# Patient Record
Sex: Male | Born: 1955 | Race: White | Hispanic: No | Marital: Married | Smoking: Never smoker
Health system: Southern US, Community
[De-identification: ages and names within clinical notes are randomized; demographics above are authoritative.]

## PROBLEM LIST (undated history)

## (undated) DIAGNOSIS — K219 Gastro-esophageal reflux disease without esophagitis: Secondary | ICD-10-CM

## (undated) HISTORY — PX: RHINOPLASTY: SHX2354

## (undated) HISTORY — PX: WISDOM TOOTH EXTRACTION: SHX21

## (undated) HISTORY — PX: HEMORROIDECTOMY: SUR656

## (undated) HISTORY — PX: WRIST SURGERY: SHX841

## (undated) HISTORY — PX: TRANSURETHRAL RESECTION OF PROSTATE: SHX73

---

## 1977-04-18 HISTORY — PX: APPENDECTOMY: SHX54

## 2009-04-18 HISTORY — PX: ROTATOR CUFF REPAIR: SHX139

## 2011-03-30 DIAGNOSIS — R972 Elevated prostate specific antigen [PSA]: Secondary | ICD-10-CM | POA: Insufficient documentation

## 2011-03-30 DIAGNOSIS — Z87442 Personal history of urinary calculi: Secondary | ICD-10-CM | POA: Insufficient documentation

## 2012-02-10 DIAGNOSIS — N401 Enlarged prostate with lower urinary tract symptoms: Secondary | ICD-10-CM | POA: Insufficient documentation

## 2012-03-29 DIAGNOSIS — Z8042 Family history of malignant neoplasm of prostate: Secondary | ICD-10-CM | POA: Insufficient documentation

## 2013-06-25 DIAGNOSIS — K222 Esophageal obstruction: Secondary | ICD-10-CM | POA: Insufficient documentation

## 2014-01-24 ENCOUNTER — Emergency Department (HOSPITAL_COMMUNITY)
Admission: EM | Admit: 2014-01-24 | Discharge: 2014-01-24 | Disposition: A | Discharge status: Home / Self Care | Payer: Worker's Compensation | Attending: Emergency Medicine | Admitting: Emergency Medicine

## 2014-01-24 ENCOUNTER — Emergency Department (HOSPITAL_COMMUNITY): Payer: Worker's Compensation

## 2014-01-24 ENCOUNTER — Encounter (HOSPITAL_COMMUNITY): Payer: Self-pay | Admitting: Emergency Medicine

## 2014-01-24 DIAGNOSIS — Y990 Civilian activity done for income or pay: Secondary | ICD-10-CM | POA: Diagnosis not present

## 2014-01-24 DIAGNOSIS — Y9289 Other specified places as the place of occurrence of the external cause: Secondary | ICD-10-CM | POA: Diagnosis not present

## 2014-01-24 DIAGNOSIS — S82892A Other fracture of left lower leg, initial encounter for closed fracture: Secondary | ICD-10-CM

## 2014-01-24 DIAGNOSIS — S92152A Displaced avulsion fracture (chip fracture) of left talus, initial encounter for closed fracture: Secondary | ICD-10-CM | POA: Insufficient documentation

## 2014-01-24 DIAGNOSIS — Z8719 Personal history of other diseases of the digestive system: Secondary | ICD-10-CM | POA: Diagnosis not present

## 2014-01-24 DIAGNOSIS — X58XXXA Exposure to other specified factors, initial encounter: Secondary | ICD-10-CM | POA: Insufficient documentation

## 2014-01-24 DIAGNOSIS — Y9389 Activity, other specified: Secondary | ICD-10-CM | POA: Insufficient documentation

## 2014-01-24 DIAGNOSIS — S99912A Unspecified injury of left ankle, initial encounter: Secondary | ICD-10-CM | POA: Diagnosis present

## 2014-01-24 HISTORY — DX: Gastro-esophageal reflux disease without esophagitis: K21.9

## 2014-01-24 MED ORDER — HYDROCODONE-ACETAMINOPHEN 5-325 MG PO TABS: 1.0000 | ORAL_TABLET | Freq: Four times a day (QID) | ORAL | Status: AC | PRN

## 2014-01-24 NOTE — ED Provider Notes (Signed)
Medical screening examination/treatment/procedure(s) were performed by non-physician practitioner and as supervising physician I was immediately available for consultation/collaboration.   EKG Interpretation None      Devoria AlbeIva Trevone Prestwood, MD, Armando GangFACEP   Ward GivensIva L Jeryn Cerney, MD 01/24/14 [phone]

## 2014-01-24 NOTE — ED Notes (Signed)
Pt taken out in wheelchair with wife. No belongings left in room.

## 2014-01-24 NOTE — Progress Notes (Signed)
Orthopedic Tech Progress Note Patient Details:  Thomas KellsDavid Kane July 16, 1955 [id number] Applied cam walker to LLE.  Pt. ambulated well but c/o discomfort.  Adjusted cam walker to increase pt.'s level of comfort. Ortho Devices Type of Ortho Device: CAM walker Ortho Device/Splint Location: LLE Ortho Device/Splint Interventions: Application   Lesle ChrisGilliland, Terald Jump L 01/24/2014, 11:17 AM

## 2014-01-24 NOTE — ED Provider Notes (Signed)
CSN: [id number]     Arrival date & time 01/24/14  [id number] History  This chart was scribed for non-physician practitioner, Santiago GladHeather Kierstynn Babich, PA-C, working with Ward GivensIva L Knapp, MD by Charline BillsEssence Howell, ED Scribe. This patient was seen in room TR07C/TR07C and the patient's care was started at 10:42 AM.   Chief Complaint  Patient presents with  . Ankle Injury   The history is provided by the patient. No language interpreter was used.   HPI Comments: Thomas Kane is a 58 y.o. male who presents to the Emergency Department complaining of gradually worsening L ankle pain PTA. Pt states that he stepped off a step stool at work and rolled his ankle. Pt states that he heard a "pop" in his ankle before landing on his R hip. Pt does maintenance for restaurants for work. Pt reports associated L ankle swelling. He denies hitting his head or LOC. Pt also denies numbness/tingling. No medications taken PTA.  Past Medical History  Diagnosis Date  . GERD (gastroesophageal reflux disease)    Past Surgical History  Procedure Laterality Date  . Appendectomy  1979  . Wrist surgery    . Wisdom tooth extraction    . Rhinoplasty    . Rotator cuff repair  2011  . Hemorroidectomy    . Transurethral resection of prostate  2011 & 2014   History reviewed. No pertinent family history. History  Substance Use Topics  . Smoking status: Never Smoker   . Smokeless tobacco: Not on file  . Alcohol Use: No    Review of Systems  Musculoskeletal: Positive for arthralgias and joint swelling.  Neurological: Negative for syncope and numbness.  All other systems reviewed and are negative.  Allergies  Review of patient's allergies indicates no known allergies.  Home Medications   Prior to Admission medications   Not on File   Triage Vitals: BP 146/81  Pulse 74  Temp(Src) 98.3 F (36.8 C) (Oral)  Resp 16  Ht 5\' 7"  (1.702 m)  Wt 175 lb (79.379 kg)  BMI 27.40 kg/m2  SpO2 97% Physical Exam  Nursing note and vitals  reviewed. Constitutional: He is oriented to person, place, and time. He appears well-developed and well-nourished. No distress.  HENT:  Head: Normocephalic and atraumatic.  Eyes: Conjunctivae and EOM are normal.  Neck: Neck supple. No tracheal deviation present.  Cardiovascular: Normal rate and regular rhythm.   Pulses:      Dorsalis pedis pulses are 2+ on the left side.  Pulmonary/Chest: Effort normal and breath sounds normal. No respiratory distress.  Musculoskeletal: Normal range of motion.       Right hip: He exhibits normal range of motion.  L foot:  Distal sensation of L foot intact 2+ DP pulses Swelling over lateral malleolus  Tenderness to palpation of lateral malleolus and medial malleolus R hip: Full ROM No bruising, erythema or edema Able to bear weight on R leg  Neurological: He is alert and oriented to person, place, and time.  Skin: Skin is warm and dry.  Psychiatric: He has a normal mood and affect. His behavior is normal.   ED Course  Procedures (including critical care time) DIAGNOSTIC STUDIES: Oxygen Saturation is 97% on RA, normal by my interpretation.    COORDINATION OF CARE: 10:54 AM-Discussed treatment plan which includes XR and medication for pain with pt at bedside and pt agreed to plan.   Labs Review Labs Reviewed - No data to display  Imaging Review Dg Ankle Complete Left  01/24/2014  CLINICAL DATA:  58 year old male with acute onset of pain and swelling of the lateral malleolus of the left ankle after falling off a step stool at the work place.  EXAM: LEFT ANKLE COMPLETE - 3+ VIEW  COMPARISON:  None.  FINDINGS: Small avulsion fracture at the distal tip of the lateral malleolus with associated soft tissue swelling. The ankle mortise remains congruent. The talar dome is intact. Normal bony mineralization. No lytic blastic osseous lesion. No ankle joint effusion. A well corticated ossific body at the base of the fifth metatarsal is most consistent with  nonunion over remote healed fifth metatarsal fracture.  IMPRESSION: 1. Acute avulsion fracture at the distal tip of the fibula. 2. 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   Electronically Signed   By: Malachy MoanHeath  McCullough M.D.   On: 01/24/2014 10:30    EKG Interpretation None      MDM   Final diagnoses:  None   Patient presents today with left ankle injury.  Xray showing avulsion fracture of the fibula.  Fracture is closed.  Patient neurovascularly intact.  Patient did not feel stable on crutches.  Patient given Cam Walker.  Given referral to Orthopedics.  Stable for discharge.    I personally performed the services described in this documentation, which was scribed in my presence. The recorded information has been reviewed and is accurate.    Santiago GladHeather Drexel Ivey, PA-C 01/24/14 1153

## 2014-01-24 NOTE — ED Notes (Signed)
Pt reports rolling L ankle; L ankle swollen; + DP pulse, CMS intact distal to injury

## 2014-01-24 NOTE — ED Notes (Signed)
Patient transported to X-ray 

## 2014-01-24 NOTE — ED Notes (Signed)
Ortho coming down to apply cam walker. Pt wears size 9.5 shoe.

## 2014-08-05 DIAGNOSIS — M25512 Pain in left shoulder: Secondary | ICD-10-CM | POA: Insufficient documentation

## 2014-08-12 DIAGNOSIS — S43014A Anterior dislocation of right humerus, initial encounter: Secondary | ICD-10-CM | POA: Insufficient documentation

## 2015-03-11 DIAGNOSIS — Z9889 Other specified postprocedural states: Secondary | ICD-10-CM | POA: Insufficient documentation

## 2015-04-05 DIAGNOSIS — S46211D Strain of muscle, fascia and tendon of other parts of biceps, right arm, subsequent encounter: Secondary | ICD-10-CM | POA: Insufficient documentation

## 2016-01-06 DIAGNOSIS — E663 Overweight: Secondary | ICD-10-CM | POA: Insufficient documentation

## 2016-03-01 IMAGING — CR DG ANKLE COMPLETE 3+V*L*
3 series · 3 of 3 positions shown · non-contrast
Comparison: None.

CLINICAL DATA: 58-year-old male with acute onset of pain and
swelling of the lateral malleolus of the left ankle after falling
off a step stool at the work place.

EXAM:
LEFT ANKLE COMPLETE - 3+ VIEW

[t ankle joint ap left]
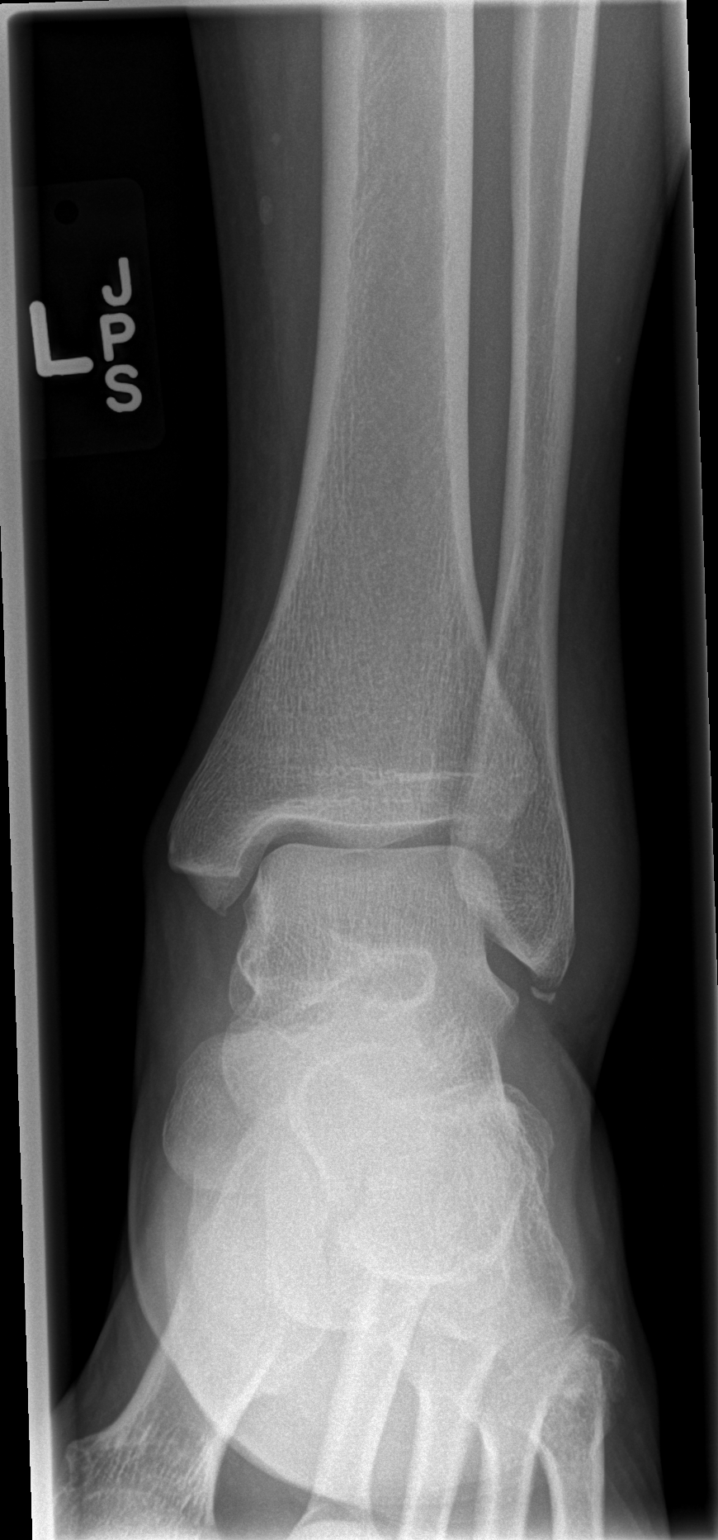

[t ankle joint oblique left]
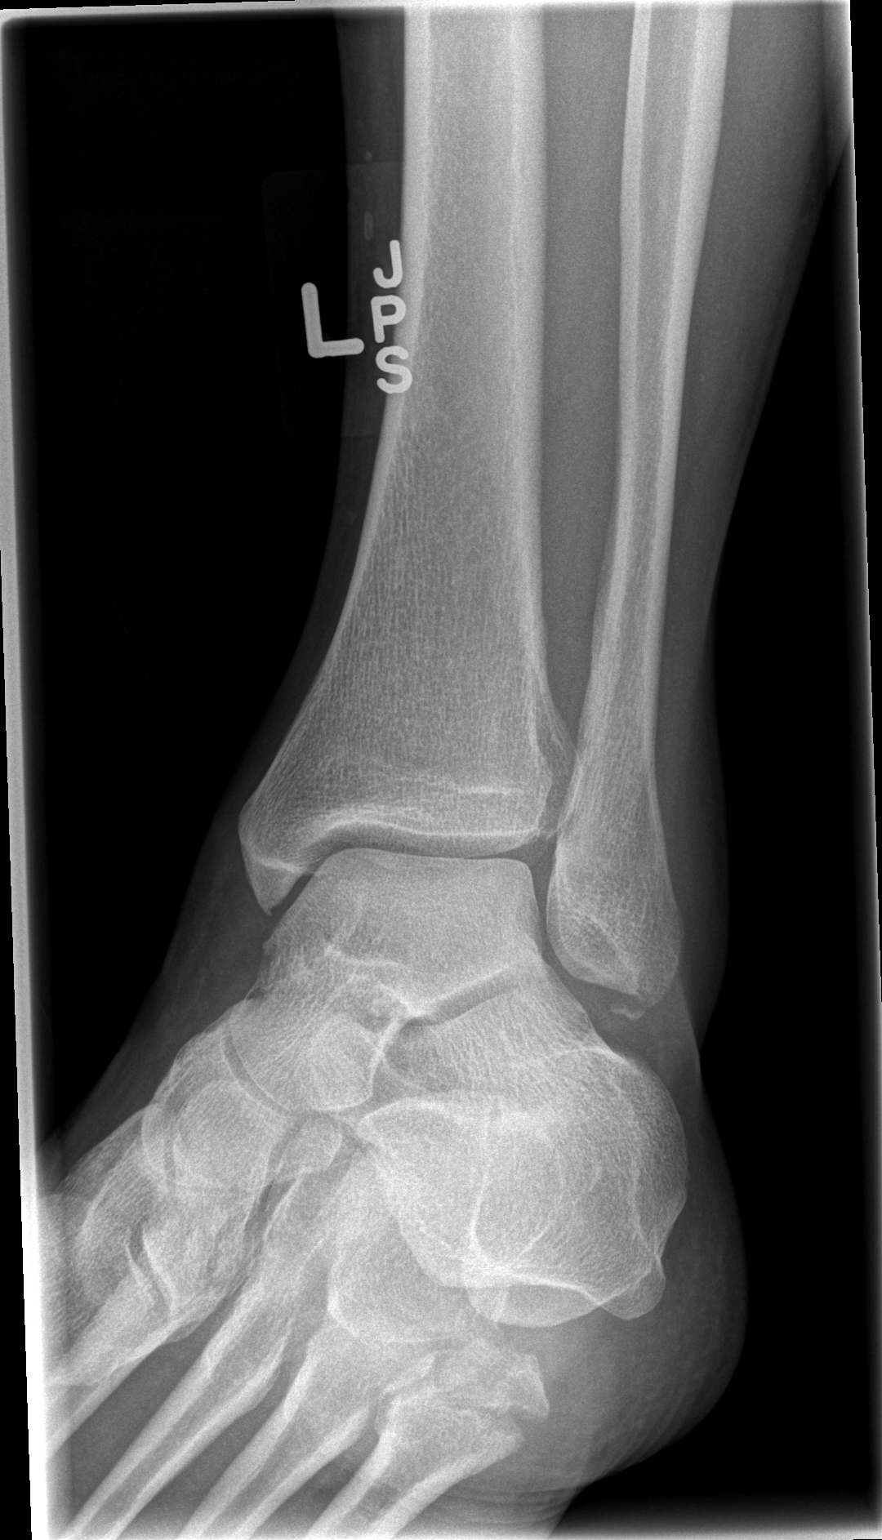

[t ankle joint lat left]
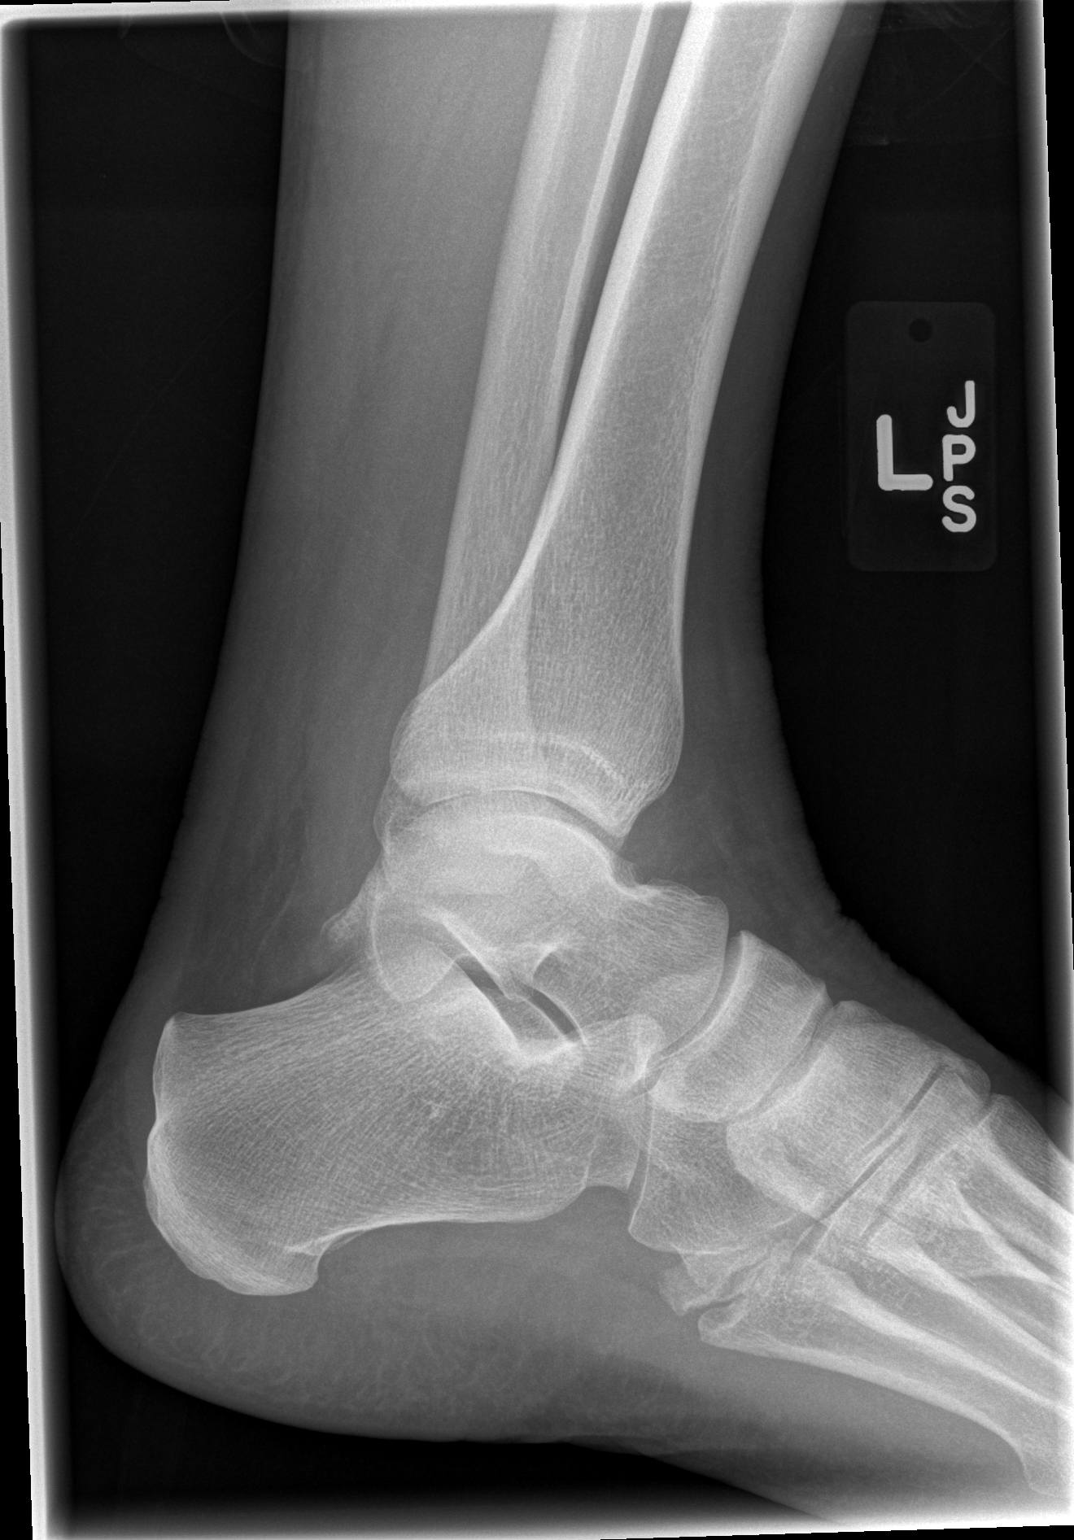

[3 of 3 positions shown; findings below may reference images not displayed]

FINDINGS: Small avulsion fracture at the distal tip of the lateral malleolus
with associated soft tissue swelling. The ankle mortise remains
congruent. The talar dome is intact. Normal bony mineralization. No
lytic blastic osseous lesion. No ankle joint effusion. A well
corticated ossific body at the base of the fifth metatarsal is most
consistent with nonunion over remote healed fifth metatarsal
fracture.
IMPRESSION: 1. Acute avulsion fracture at the distal tip of the fibula.
2. Nonunion of a remote healed fracture through the base of the
fifth metatarsal.

## 2016-07-08 DIAGNOSIS — Z85828 Personal history of other malignant neoplasm of skin: Secondary | ICD-10-CM | POA: Insufficient documentation

## 2016-08-01 DIAGNOSIS — E7800 Pure hypercholesterolemia, unspecified: Secondary | ICD-10-CM | POA: Insufficient documentation

## 2018-10-12 DIAGNOSIS — Z789 Other specified health status: Secondary | ICD-10-CM | POA: Insufficient documentation

## 2019-01-23 DIAGNOSIS — N320 Bladder-neck obstruction: Secondary | ICD-10-CM | POA: Insufficient documentation

## 2020-02-28 DIAGNOSIS — I10 Essential (primary) hypertension: Secondary | ICD-10-CM | POA: Insufficient documentation

## 2020-03-31 ENCOUNTER — Ambulatory Visit (INDEPENDENT_AMBULATORY_CARE_PROVIDER_SITE_OTHER): Payer: Medicare Other

## 2020-03-31 ENCOUNTER — Ambulatory Visit (INDEPENDENT_AMBULATORY_CARE_PROVIDER_SITE_OTHER): Payer: Medicare Other | Admitting: Podiatry

## 2020-03-31 ENCOUNTER — Other Ambulatory Visit: Payer: Self-pay

## 2020-03-31 ENCOUNTER — Encounter: Payer: Self-pay | Admitting: Podiatry

## 2020-03-31 DIAGNOSIS — G5761 Lesion of plantar nerve, right lower limb: Secondary | ICD-10-CM

## 2020-03-31 DIAGNOSIS — M2041 Other hammer toe(s) (acquired), right foot: Secondary | ICD-10-CM

## 2020-03-31 DIAGNOSIS — M79671 Pain in right foot: Secondary | ICD-10-CM

## 2020-03-31 MED ORDER — BETAMETHASONE SOD PHOS & ACET 6 (3-3) MG/ML IJ SUSP
3.0000 mg | Freq: Once | INTRAMUSCULAR | Status: AC
  Administered 2020-03-31: 3 mg

## 2020-03-31 NOTE — Progress Notes (Signed)
°  Subjective:  Patient ID: Thomas Kane, male    DOB: 09/05/1955,  MRN: [id number]  Chief Complaint  Patient presents with   Foot Injury    Pt states history of injury 7-8 years ago "I dropped a hammer on the top of my foot and broke my 3rd metatarsal"    Toe Pain    Pt states 6-7 month 3rd toe pain which worsens with activity.    64 y.o. male presents with the above complaint. History confirmed with patient.   Objective:  Physical Exam: warm, good capillary refill, no trophic changes or ulcerative lesions, normal DP and PT pulses and normal sensory exam.  Right Foot: tenderness between the 3rd and 4th metatarsal head , Mulder's click. No pain on palpation 3rd toe.  No images are attached to the encounter.  Radiographs: X-ray of the right foot: no dislocation, swelling or degenerative changes noted and hx of 3rd toe fracture, 1st toe fracture, well healed  Assessment:   1. Morton neuroma, right   2. Hammertoe of right foot    Plan:  Patient was evaluated and treated and all questions answered.  Morton Neuroma -Educated on etiology -Educated on padding and proper shoegear -XR reviewed with patient -Injection delivered to the affected interspaces  Procedure: Neuroma Injection Location: Right 3rd interspace Skin Prep: Alcohol. Injectate: 0.5 cc 0.5% marcaine plain, 0.5 cc dexamethasone phosphate. Disposition: Patient tolerated procedure well. Injection site dressed with a band-aid.  Return in about 3 weeks (around 04/21/2020) for Neuroma.

## 2020-04-03 ENCOUNTER — Telehealth: Payer: Self-pay | Admitting: Podiatry

## 2020-04-03 NOTE — Telephone Encounter (Signed)
Pt states his injection only worked for 24 hours, and is experiencing a lot of pain. He has an appt in 3 weeks, but would like to know if you need to see him sooner. Please advise.

## 2020-04-03 NOTE — Telephone Encounter (Signed)
Called patient. Moved up patient's appt time to the 28th @1 :30. Canceled appt on Jan 7

## 2020-04-14 ENCOUNTER — Ambulatory Visit (INDEPENDENT_AMBULATORY_CARE_PROVIDER_SITE_OTHER): Payer: Medicare Other | Admitting: Podiatry

## 2020-04-14 ENCOUNTER — Other Ambulatory Visit: Payer: Self-pay

## 2020-04-14 DIAGNOSIS — G5761 Lesion of plantar nerve, right lower limb: Secondary | ICD-10-CM | POA: Diagnosis not present

## 2020-04-14 MED ORDER — BETAMETHASONE SOD PHOS & ACET 6 (3-3) MG/ML IJ SUSP: 3.0000 mg | Freq: Once | INTRAMUSCULAR | Status: AC

## 2020-04-14 NOTE — Progress Notes (Signed)
  Subjective:  Patient ID: Thomas Kane, male    DOB: Jul 20, 1955,  MRN: [id number]  Chief Complaint  Patient presents with  . Neuroma    Pt states injection lasted 1 day, found ibuprofen/pad to be more effective.    64 y.o. male presents with the above complaint. History confirmed with patient.   Objective:  Physical Exam: warm, good capillary refill, no trophic changes or ulcerative lesions, normal DP and PT pulses and normal sensory exam.  Right Foot: tenderness between the 3rd and 4th metatarsal head , Mulder's click. No pain on palpation 3rd toe.  Assessment:   1. Morton neuroma, right    Plan:  Patient was evaluated and treated and all questions answered.  Morton Neuroma -Repeat injection 3rd interspace. -Continue padding  Procedure: Neuroma Injection Location: Right 3rd interspace Skin Prep: Alcohol. Injectate: 0.5 cc 0.5% marcaine plain, 0.5 cc dexamethasone phosphate. Disposition: Patient tolerated procedure well. Injection site dressed with a band-aid.   Return in about 4 weeks (around 05/12/2020) for Neuroma.

## 2020-04-24 ENCOUNTER — Ambulatory Visit: Payer: Medicare Other | Admitting: Podiatry

## 2020-05-15 ENCOUNTER — Ambulatory Visit (INDEPENDENT_AMBULATORY_CARE_PROVIDER_SITE_OTHER): Payer: Medicare Other | Admitting: Podiatry

## 2020-05-15 ENCOUNTER — Ambulatory Visit (INDEPENDENT_AMBULATORY_CARE_PROVIDER_SITE_OTHER): Payer: Medicare Other

## 2020-05-15 ENCOUNTER — Other Ambulatory Visit: Payer: Self-pay

## 2020-05-15 DIAGNOSIS — G5761 Lesion of plantar nerve, right lower limb: Secondary | ICD-10-CM | POA: Diagnosis not present

## 2020-05-15 DIAGNOSIS — M79672 Pain in left foot: Secondary | ICD-10-CM

## 2020-05-15 DIAGNOSIS — S92302S Fracture of unspecified metatarsal bone(s), left foot, sequela: Secondary | ICD-10-CM

## 2020-05-15 DIAGNOSIS — M898X9 Other specified disorders of bone, unspecified site: Secondary | ICD-10-CM | POA: Diagnosis not present

## 2020-05-15 NOTE — Progress Notes (Signed)
  Subjective:  Patient ID: Thomas Kane, male    DOB: [birth year],  MRN: [id number]  Chief Complaint  Patient presents with  . Neuroma    Still painful with mild improvement, injections do not last longer than 24 hours.  . Foot Injury    Left foot old fracture 7 years ago, causing pain.    65 y.o. male presents with the above complaint. History confirmed with patient. States that he has a bump on the left foot at his 5th metatarsal area from where he broke it. States he saw an orthopedist but was told there was nothing to do for it. States that the bump causes him pain in certain shoes and when he sits certain ways.  Objective:  Physical Exam: warm, good capillary refill, no trophic changes or ulcerative lesions, normal DP and PT pulses and normal sensory exam.  Left Foot: tenderness at the 5th metatarsal base with prominent exostosis dorsally. Right Foot: tenderness between the 3rd and 4th metatarsal head , Mulder's click. No pain on palpation 3rd toe.  Assessment:   1. Closed avulsion fracture of metatarsal bone, left, sequela   2. Morton neuroma, right   3. Bony exostosis    Plan:  Patient was evaluated and treated and all questions answered.  Morton Neuroma -Start sclerosing injection right.  Procedure: Neurolysis Location: Right 3rd interspace Skin Prep: Alcohol. Injectate: 4% alcohol sclerosing injection. Disposition: Patient tolerated procedure well. Injection site dressed with a band-aid.  Exostosis left 5th met base, sequela of fracture -XR reviewed with patient -Would consider surgical exostectomy at a later date. Plan for MIS approach.  Return in about 2 weeks (around 05/29/2020) for Neuroma, Left.

## 2020-05-27 ENCOUNTER — Other Ambulatory Visit: Payer: Self-pay | Admitting: Podiatry

## 2020-05-29 ENCOUNTER — Other Ambulatory Visit: Payer: Self-pay

## 2020-05-29 ENCOUNTER — Ambulatory Visit: Payer: Medicare Other | Admitting: Podiatry

## 2020-05-29 ENCOUNTER — Ambulatory Visit (INDEPENDENT_AMBULATORY_CARE_PROVIDER_SITE_OTHER): Payer: Medicare Other | Admitting: Podiatry

## 2020-05-29 DIAGNOSIS — G5761 Lesion of plantar nerve, right lower limb: Secondary | ICD-10-CM

## 2020-06-01 ENCOUNTER — Encounter: Payer: Self-pay | Admitting: Podiatry

## 2020-06-01 ENCOUNTER — Ambulatory Visit (INDEPENDENT_AMBULATORY_CARE_PROVIDER_SITE_OTHER): Payer: Medicare Other | Admitting: Podiatry

## 2020-06-01 ENCOUNTER — Ambulatory Visit (INDEPENDENT_AMBULATORY_CARE_PROVIDER_SITE_OTHER): Payer: Medicare Other

## 2020-06-01 ENCOUNTER — Other Ambulatory Visit: Payer: Self-pay

## 2020-06-01 DIAGNOSIS — M779 Enthesopathy, unspecified: Secondary | ICD-10-CM | POA: Diagnosis not present

## 2020-06-01 DIAGNOSIS — G5761 Lesion of plantar nerve, right lower limb: Secondary | ICD-10-CM

## 2020-06-01 NOTE — Progress Notes (Signed)
Subjective:   Patient ID: Thomas Kane, male   DOB: 65 y.o.   MRN: [id number]   HPI Patient presents stating he continues to have pain in his right forefoot and states that he has had 3 different injections and they only give him approximate 24 hours of reduction of symptoms.  The pain is now been present for approximately 4 months and has been seen by Dr. Samuella Cota   ROS      Objective:  Physical Exam  Neurovascular status intact with patient noted to have exquisite discomfort which appears to emanate between the fourth metatarsal phalangeal joint and the third interspace with occasional radiating pains but pain mostly with weightbearing pressure     Assessment:  Difficult to ascertain between an inflammatory capsulitis versus neuroma symptomatology right     Plan:  H&P reviewed both conditions and the differences between them and the difficulty of making this diagnosis.  I did x-ray today and at this point I went ahead and I did anesthetize the right lateral foot aspirated the fourth MPJ getting out a small amount of clear fluid injected quarter cc dexamethasone Kenalog.  I did discuss that this may require neuroma excision if he does not respond to this and he has a Cam walker at home and I want him to wear that full-time for 2 weeks to reduce all pressure on his foot to see whether or not we can problem without surgery.  He is to see Dr. Samuella Cota back for a visit in approximate 2 weeks  X-rays were negative for signs of fracture did not indicate a metatarsal parabola issue

## 2020-06-10 ENCOUNTER — Other Ambulatory Visit: Payer: Self-pay | Admitting: Podiatry

## 2020-06-10 DIAGNOSIS — G5761 Lesion of plantar nerve, right lower limb: Secondary | ICD-10-CM

## 2020-06-15 NOTE — Progress Notes (Signed)
  Subjective:  Patient ID: Thomas Kane, male    DOB: 1956/03/17,  MRN: [id number]  Chief Complaint  Patient presents with  . Neuroma    Right neuroma "It is the same" previous injection lasted 36 hours.    65 y.o. male presents with the above complaint. History confirmed with patient.  Objective:  Physical Exam: warm, good capillary refill, no trophic changes or ulcerative lesions, normal DP and PT pulses and normal sensory exam.  Left Foot: tenderness at the 5th metatarsal base with prominent exostosis dorsally. Right Foot: tenderness between the 3rd and 4th metatarsal head , Mulder's click. No pain on palpation 3rd toe.  Assessment:   1. Morton neuroma, right    Plan:  Patient was evaluated and treated and all questions answered.  Morton Neuroma -Sclerosing injection #2  Procedure: Neurolysis Location: Right 3rd interspace Skin Prep: Alcohol. Injectate: 4% alcohol sclerosing injection. Disposition: Patient tolerated procedure well. Injection site dressed with a band-aid.   Exostosis left 5th met base, sequela of fracture -XR reviewed with patient -Would consider surgical exostectomy at a later date. Plan for MIS approach.  No follow-ups on file.

## 2020-06-16 ENCOUNTER — Ambulatory Visit (INDEPENDENT_AMBULATORY_CARE_PROVIDER_SITE_OTHER): Payer: Medicare Other | Admitting: Podiatry

## 2020-06-16 ENCOUNTER — Other Ambulatory Visit: Payer: Self-pay

## 2020-06-16 DIAGNOSIS — G5761 Lesion of plantar nerve, right lower limb: Secondary | ICD-10-CM | POA: Diagnosis not present

## 2020-06-16 DIAGNOSIS — M779 Enthesopathy, unspecified: Secondary | ICD-10-CM

## 2020-06-16 NOTE — Progress Notes (Signed)
  Subjective:  Patient ID: Thomas Kane, male    DOB: 04/19/1955,  MRN: [id number]  Chief Complaint  Patient presents with  . Follow-up    Follow up - R/ Neuroma & capsulitis- foot is better- Pt states he is walking in the inside of his foot. Pt rates his pain 2/10-no swelling -pt is not taking anything- pt states pain is when he steps on it    65 y.o. male presents with the above complaint. History confirmed with patient.  Objective:  Physical Exam: warm, good capillary refill, no trophic changes or ulcerative lesions, normal DP and PT pulses and normal sensory exam.  Right Foot: tenderness between the 3rd and 4th metatarsal head, 4th met head , Mulder's click. No pain on palpation 3rd toe.  Assessment:   1. Morton neuroma, right   2. Capsulitis    Plan:  Patient was evaluated and treated and all questions answered.  Capsulitis, neuroma -Defer Injection today -Unclear if 4th MPJ pain is worsened, or if this is concomitant neuroma/intermetatarsal bursitis. Will monitor and consider repeat joint or nerve injection next visit.  Exostosis left 5th met base, sequela of fracture -Would consider surgical exostectomy at a later date. Plan for MIS approach.  Return in about 3 weeks (around 07/07/2020) for Capsulitis, Neuroma.

## 2020-07-07 ENCOUNTER — Other Ambulatory Visit: Payer: Self-pay

## 2020-07-07 ENCOUNTER — Ambulatory Visit (INDEPENDENT_AMBULATORY_CARE_PROVIDER_SITE_OTHER): Payer: Medicare Other | Admitting: Podiatry

## 2020-07-07 DIAGNOSIS — M779 Enthesopathy, unspecified: Secondary | ICD-10-CM

## 2020-07-07 DIAGNOSIS — G5761 Lesion of plantar nerve, right lower limb: Secondary | ICD-10-CM | POA: Diagnosis not present

## 2020-07-16 NOTE — Progress Notes (Signed)
  Subjective:  Patient ID: Thomas Kane, male    DOB: 06/04/1955,  MRN: [id number]  Chief Complaint  Patient presents with  . Neuroma    Right foot neuroma. Pt. States he is unsure on improvement. His Urologist placed him on $Remo'1800mg'WGRpF$  of ibuprofen. Pt. States he prefers not to have injection today.     65 y.o. male presents with the above complaint. History confirmed with patient.  Objective:  Physical Exam: warm, good capillary refill, no trophic changes or ulcerative lesions, normal DP and PT pulses and normal sensory exam.  Right Foot: tenderness between the 3rd and 4th metatarsal head, 4th met head , Mulder's click. No pain on palpation 3rd toe.  Assessment:   1. Morton neuroma, right   2. Capsulitis    Plan:  Patient was evaluated and treated and all questions answered.  Capsulitis, neuroma -Defer injection.  We will ideally get MRI however will hold off present given current anti-inflammatory medicine for urologic issues  Exostosis left 5th met base, sequela of fracture -Would consider surgical exostectomy at a later date. Plan for MIS approach.  Return in about 6 weeks (around 08/18/2020) for Neuroma f/u.

## 2020-08-03 DIAGNOSIS — N451 Epididymitis: Secondary | ICD-10-CM | POA: Insufficient documentation

## 2020-08-21 ENCOUNTER — Ambulatory Visit (INDEPENDENT_AMBULATORY_CARE_PROVIDER_SITE_OTHER): Payer: Medicare Other | Admitting: Podiatry

## 2020-08-21 ENCOUNTER — Other Ambulatory Visit: Payer: Self-pay

## 2020-08-21 DIAGNOSIS — M779 Enthesopathy, unspecified: Secondary | ICD-10-CM | POA: Diagnosis not present

## 2020-08-21 DIAGNOSIS — G5761 Lesion of plantar nerve, right lower limb: Secondary | ICD-10-CM | POA: Diagnosis not present

## 2020-08-21 NOTE — Progress Notes (Signed)
  Subjective:  Patient ID: Thomas Kane, male    DOB: Aug 18, 1955,  MRN: [id number]  Chief Complaint  Patient presents with  . Neuroma    Pt states was on high dose Ibuprofen 1836m daily and recently discontinued, believes his neuroma pain is resolved.    65y.o. male presents with the above complaint. History confirmed with patient.  Objective:  Physical Exam: warm, good capillary refill, no trophic changes or ulcerative lesions, normal DP and PT pulses and normal sensory exam.  Right Foot: no tenderness between the 3rd and 4th metatarsal head, 4th met head , Mulder's click. No pain on palpation 3rd toe.  Assessment:   1. Morton neuroma, right   2. Capsulitis    Plan:  Patient was evaluated and treated and all questions answered.  Capsulitis, neuroma -Completely resolved at this point. Would continue padding but f/u as needed for recurrence.  Exostosis left 5th met base, sequela of fracture -Would consider surgical exostectomy at a later date. Plan for MIS approach.  No follow-ups on file.

## 2020-11-27 ENCOUNTER — Other Ambulatory Visit: Payer: Self-pay

## 2020-11-27 ENCOUNTER — Ambulatory Visit (INDEPENDENT_AMBULATORY_CARE_PROVIDER_SITE_OTHER): Payer: Medicare Other | Admitting: Podiatry

## 2020-11-27 DIAGNOSIS — M79672 Pain in left foot: Secondary | ICD-10-CM

## 2020-11-27 DIAGNOSIS — M779 Enthesopathy, unspecified: Secondary | ICD-10-CM | POA: Diagnosis not present

## 2020-11-27 DIAGNOSIS — M898X9 Other specified disorders of bone, unspecified site: Secondary | ICD-10-CM

## 2020-11-27 NOTE — Progress Notes (Signed)
  Subjective:  Patient ID: Thomas Kane, male    DOB: Oct 28, 1955,  MRN: [id number]  Chief Complaint  Patient presents with   Foot Pain    Surgical consult bone spur   65 y.o. male presents with the above complaint. History confirmed with patient. States the bone spur hurts especially when he hits it. Would like to discuss removal as previously discussed. Objective:  Physical Exam: warm, good capillary refill, no trophic changes or ulcerative lesions, normal DP and PT pulses and normal sensory exam.  Right Foot: no tenderness between the 3rd and 4th metatarsal head, 4th met head , Mulder's click. No pain on palpation 3rd toe. Left foot: prominence lateral 5th metatarsal with POP. No fluctuance, no surrounding warmth or erythema.  Assessment:   1. Bony exostosis   2. Pain in left foot   3. Capsulitis    Plan:  Patient was evaluated and treated and all questions answered.  Exostosis left 5th met base, sequela of fracture -Patient would like to proceed with surgical intervention. Discussed procedure in detail including post-op course. -Patient has failed all conservative therapy and wishes to proceed with surgical intervention. All risks, benefits, and alternatives discussed with patient. No guarantees given. Consent reviewed and signed by patient. -Planned procedures: left foot 5th metatarsal MIS exostectomy -ASA 2 - Patient with mild systemic disease with no functional limitations  No follow-ups on file.

## 2020-12-09 ENCOUNTER — Telehealth: Payer: Self-pay

## 2020-12-14 NOTE — Telephone Encounter (Signed)
completed

## 2020-12-23 ENCOUNTER — Encounter: Payer: Self-pay | Admitting: Podiatry

## 2020-12-23 ENCOUNTER — Other Ambulatory Visit: Payer: Self-pay | Admitting: Podiatry

## 2020-12-23 DIAGNOSIS — M898X9 Other specified disorders of bone, unspecified site: Secondary | ICD-10-CM

## 2020-12-23 DIAGNOSIS — M7752 Other enthesopathy of left foot: Secondary | ICD-10-CM | POA: Diagnosis not present

## 2020-12-23 MED ORDER — OXYCODONE-ACETAMINOPHEN 5-325 MG PO TABS: 1.0000 | ORAL_TABLET | ORAL | 0 refills | Status: AC | PRN

## 2020-12-23 MED ORDER — CEPHALEXIN 500 MG PO CAPS: ORAL_CAPSULE | ORAL | 0 refills | Status: AC

## 2020-12-25 ENCOUNTER — Telehealth: Payer: Self-pay | Admitting: *Deleted

## 2020-12-25 NOTE — Telephone Encounter (Signed)
Patient is calling and would like to know how should he treat the plastic coated bandage on foot? Please advise.

## 2020-12-26 ENCOUNTER — Telehealth: Payer: Self-pay | Admitting: Sports Medicine

## 2020-12-26 NOTE — Telephone Encounter (Signed)
Patient called answering service with ?s on how to redress his foot. He states that he has instructions given to him but it does not say anything about the plastic piece. I advised him to follow the instructions as written and to leave the plastic piece in place. I have reached out to Dr. Samuella Cota for clarification and advised patient if there's any changes then I will call him back. Dr. Marylene Land

## 2020-12-29 ENCOUNTER — Other Ambulatory Visit: Payer: Self-pay

## 2020-12-29 ENCOUNTER — Ambulatory Visit (INDEPENDENT_AMBULATORY_CARE_PROVIDER_SITE_OTHER): Payer: Medicare Other

## 2020-12-29 ENCOUNTER — Ambulatory Visit (INDEPENDENT_AMBULATORY_CARE_PROVIDER_SITE_OTHER): Payer: Medicare Other | Admitting: Podiatry

## 2020-12-29 DIAGNOSIS — M898X9 Other specified disorders of bone, unspecified site: Secondary | ICD-10-CM

## 2020-12-29 NOTE — Progress Notes (Signed)
  Subjective:  Patient ID: Thomas Kane, male    DOB: 05-Aug-1955,  MRN: [id number]  Chief Complaint  Patient presents with   Routine Post Op    POV #1 DOS 12/23/2020 LT FOOT MINIMALLY INVASIVE REMOVAL OF 5TH METATARSAL BONE SPUR   DOS: 12/23/20 Procedure: Lt foot MIS exostectomy 5th metatarsal   65 y.o. male presents with the above complaint. History confirmed with patient.   Objective:  Physical Exam: tenderness at the surgical site, local edema noted, and calf supple, nontender. Incision: healing well, no significant drainage, no dehiscence, no significant erythema  No images are attached to the encounter.  Radiographs: X-ray of the left foot: reduction of exostosis without other acute changes  Assessment:   1. Bony exostosis     Plan:  Patient was evaluated and treated and all questions answered.  Post-operative State -XR reviewed with patient -Ok to start showering at this time. Advised they cannot soak. -Dressing applied consisting of povidone and band-aid -WBAT in Surgical shoe -XRs needed at follow-up: none  No follow-ups on file.

## 2021-01-01 NOTE — Telephone Encounter (Signed)
See Epic On Hand Encounter

## 2021-01-15 ENCOUNTER — Other Ambulatory Visit: Payer: Self-pay

## 2021-01-15 ENCOUNTER — Ambulatory Visit (INDEPENDENT_AMBULATORY_CARE_PROVIDER_SITE_OTHER): Payer: Medicare Other | Admitting: Podiatry

## 2021-01-15 DIAGNOSIS — M779 Enthesopathy, unspecified: Secondary | ICD-10-CM

## 2021-01-15 DIAGNOSIS — M79672 Pain in left foot: Secondary | ICD-10-CM

## 2021-01-15 DIAGNOSIS — M898X9 Other specified disorders of bone, unspecified site: Secondary | ICD-10-CM

## 2021-01-22 NOTE — Progress Notes (Signed)
  Subjective:  Patient ID: Thomas Kane, male    DOB: 1955-05-21,  MRN: [id number]  Chief Complaint  Patient presents with   Routine Post Op    POV DOS 9.7.2022 Pt states some swelling and pain at surgical site. Pt is concerned the swelling is abnormal.   DOS: 12/23/20 Procedure: Lt foot MIS exostectomy 5th metatarsal   65 y.o. male presents with the above complaint. History confirmed with patient.   Objective:  Physical Exam: Mild tenderness at the surgical site, local edema noted, and calf supple, nontender. Incision: Well-healed Assessment:   1. Bony exostosis   2. Pain in left foot   3. Capsulitis      Plan:  Patient was evaluated and treated and all questions answered.  Post-operative State -Surgical wounds well-healed.  He does have some tenderness of his thigh which is not uncommon for the surgery.  He is able to ambulate without significant difficulty.  We discussed continued debilitation until pain improves.  We will follow-up in 1 month for recheck he is okay to wear normal shoe gear as tolerated  No follow-ups on file.

## 2021-02-19 ENCOUNTER — Encounter: Payer: Medicare Other | Admitting: Podiatry
